# Patient Record
Sex: Female | Born: 2005 | Hispanic: Yes | Marital: Single | Smoking: Never smoker
Health system: Southern US, Community
[De-identification: ages and names within clinical notes are randomized; demographics above are authoritative.]

---
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
## 2023-12-18 ENCOUNTER — Emergency Department (HOSPITAL_COMMUNITY): Payer: Self-pay

## 2023-12-18 ENCOUNTER — Other Ambulatory Visit: Payer: Self-pay

## 2023-12-18 ENCOUNTER — Emergency Department (HOSPITAL_COMMUNITY)
Admission: EM | Admit: 2023-12-18 | Discharge: 2023-12-18 | Disposition: A | Discharge status: Home / Self Care | Payer: Self-pay | Attending: Emergency Medicine | Admitting: Emergency Medicine

## 2023-12-18 ENCOUNTER — Encounter (HOSPITAL_COMMUNITY): Payer: Self-pay | Admitting: Emergency Medicine

## 2023-12-18 DIAGNOSIS — S90112A Contusion of left great toe without damage to nail, initial encounter: Secondary | ICD-10-CM | POA: Insufficient documentation

## 2023-12-18 DIAGNOSIS — W208XXA Other cause of strike by thrown, projected or falling object, initial encounter: Secondary | ICD-10-CM | POA: Insufficient documentation

## 2023-12-18 DIAGNOSIS — Y9239 Other specified sports and athletic area as the place of occurrence of the external cause: Secondary | ICD-10-CM | POA: Insufficient documentation

## 2023-12-18 NOTE — Discharge Instructions (Signed)
 Your x-rays are negative for fracture.  Do not be surprised if this toenail loosens and eventually falls away since there is a bruise at the very base of your toe nail this may or may not happen.  Use ice and elevation is much as is comfortable, also recommend taking ibuprofen  if needed for pain relief.  You may wear the shoe provided which can also help minimize pain when you are walking.

## 2023-12-18 NOTE — ED Provider Notes (Signed)
 [place] EMERGENCY DEPARTMENT AT [facility] Provider Note   CSN: [id number] Arrival date & time: 12/18/23  9094     History  Chief Complaint  Patient presents with   Toe Injury    Kayla Salas is a 18 y.o. female presenting with left great toe pain,  swelling and bruising. She accidentally dropped a 40lb weight on the toe at the gym ytd.  She denies any other complaint of pain and has had no treatment prior to arrival.   The history is provided by the patient.       Home Medications Prior to Admission medications   Medication Sig Start Date End Date Taking? Authorizing Provider  ibuprofen  (ADVIL ) 600 MG tablet Take 1 tablet (600 mg total) by mouth every 6 (six) hours as needed. 12/22/20   Haviland, Mildred Bollard, MD  mupirocin  cream (BACTROBAN ) 2 % Apply 1 application topically 2 (two) times daily. 12/22/20   Haviland, Mylah Baynes, MD  oxymetazoline  (AFRIN) 0.05 % nasal spray Place 2 sprays into right nostril 2 (two) times daily as needed (nosebleed). 12/15/15   Mesner, Selinda, MD      Allergies    Patient has no known allergies.    Review of Systems   Review of Systems  Constitutional:  Negative for fever.  Musculoskeletal:  Positive for arthralgias and joint swelling. Negative for myalgias.  Neurological:  Negative for weakness and numbness.    Physical Exam Updated Vital Signs BP 116/68 (BP Location: Right Arm)   Pulse 90   Temp 98 F (36.7 C) (Oral)   Resp 18   SpO2 100%  Physical Exam Constitutional:      Appearance: She is well-developed.  HENT:     Head: Atraumatic.  Cardiovascular:     Comments: Pulses equal bilaterally Musculoskeletal:        General: Tenderness present.     Cervical back: Normal range of motion.     Left foot: Normal capillary refill. Swelling and bony tenderness present. No deformity. Normal pulse.     Comments: Bruising noted to distal toe, left great toe with mild bruising at very proximal base of nail plate. Plate intact.   Skin:     General: Skin is warm and dry.  Neurological:     Mental Status: She is alert.     Sensory: No sensory deficit.     Motor: No weakness.     Deep Tendon Reflexes: Reflexes normal.     ED Results / Procedures / Treatments   Labs (all labs ordered are listed, but only abnormal results are displayed) Labs Reviewed - No data to display  EKG None  Radiology DG Foot Complete Left Result Date: 12/18/2023 CLINICAL DATA:  Pain after dropping weight on big toe. EXAM: LEFT FOOT - COMPLETE 3+ VIEW COMPARISON:  None Available. FINDINGS: There is no evidence of fracture or dislocation. There is no evidence of arthropathy or other focal bone abnormality. Soft tissue swelling of the great toe. IMPRESSION: No acute osseous abnormality. Electronically Signed   By: Harrietta Sherry M.D.   On: 12/18/2023 11:53    Procedures Procedures    Medications Ordered in ED Medications - No data to display  ED Course/ Medical Decision Making/ A&P                                 Medical Decision Making Patient presenting with a left great toe injury, direct blow occurring yesterday  with persistent bruising swelling and pain.  Differential include fracture, dislocation, contusion.  Discussed imaging, also discussed patient may or may not eventually lose the nail as there is bruising at the proximal nail plate all of the nail is completely intact at this time.  There is no significant subungual hematoma that would require trephination.  Discussed home treatment, she was placed in a postop shoe for pain assistance.  Home treatment discussed.  Amount and/or Complexity of Data Reviewed Radiology: ordered.    Details: Imaging reviewed and I agree with interpretation, no acute bony injuries.           Final Clinical Impression(s) / ED Diagnoses Final diagnoses:  Contusion of left great toe without damage to nail, initial encounter    Rx / DC Orders ED Discharge Orders     None         Birdena Mliss RIGGERS 12/18/23 1752    Suzette Pac, MD 12/22/23 1036

## 2023-12-18 NOTE — ED Triage Notes (Signed)
 Pt states she accidentally dropped a 40Ilbs weight on her L big toe while at the gym yesterday, some swelling and bruising noted to L toe, pt ambulatory from triage to room, rates pain 7/10 that is throbbing in nature
